# Patient Record
Sex: Male | Born: 1975 | Race: White | Hispanic: No | Marital: Single | Smoking: Never smoker
Health system: Southern US, Community
[De-identification: ages and names within clinical notes are randomized; demographics above are authoritative.]

## PROBLEM LIST (undated history)

## (undated) HISTORY — PX: APPENDECTOMY: SHX54

## (undated) HISTORY — PX: FRACTURE SURGERY: SHX138

---

## 2017-11-06 ENCOUNTER — Encounter (HOSPITAL_COMMUNITY): Payer: Self-pay | Admitting: Emergency Medicine

## 2017-11-06 ENCOUNTER — Emergency Department (HOSPITAL_COMMUNITY): Payer: Self-pay

## 2017-11-06 ENCOUNTER — Emergency Department (HOSPITAL_COMMUNITY)
Admission: EM | Admit: 2017-11-06 | Discharge: 2017-11-06 | Disposition: A | Discharge status: Home / Self Care | Payer: Self-pay | Attending: Emergency Medicine | Admitting: Emergency Medicine

## 2017-11-06 DIAGNOSIS — H5712 Ocular pain, left eye: Secondary | ICD-10-CM | POA: Insufficient documentation

## 2017-11-06 DIAGNOSIS — W208XXA Other cause of strike by thrown, projected or falling object, initial encounter: Secondary | ICD-10-CM | POA: Insufficient documentation

## 2017-11-06 DIAGNOSIS — H538 Other visual disturbances: Secondary | ICD-10-CM | POA: Insufficient documentation

## 2017-11-06 DIAGNOSIS — H53149 Visual discomfort, unspecified: Secondary | ICD-10-CM | POA: Insufficient documentation

## 2017-11-06 MED ORDER — PREDNISOLONE ACETATE 1 % OP SUSP
1.0000 [drp] | Freq: Once | OPHTHALMIC | Status: AC
  Administered 2017-11-06: 1 [drp] via OPHTHALMIC
  Filled 2017-11-06: qty 5

## 2017-11-06 MED ORDER — FLUORESCEIN SODIUM 1 MG OP STRP
1.0000 | ORAL_STRIP | Freq: Once | OPHTHALMIC | Status: AC
  Administered 2017-11-06: 1 via OPHTHALMIC
  Filled 2017-11-06: qty 1

## 2017-11-06 MED ORDER — PROPARACAINE HCL 0.5 % OP SOLN
1.0000 [drp] | Freq: Once | OPHTHALMIC | Status: AC
  Administered 2017-11-06: 1 [drp] via OPHTHALMIC
  Filled 2017-11-06: qty 15

## 2017-11-06 NOTE — Discharge Instructions (Addendum)
As discussed, it is important that you use the provided twice daily until you see your optometrist in 48 hours. Return here for concerning changes in your condition.

## 2017-11-06 NOTE — ED Notes (Signed)
Patient transported to CT 
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
## 2017-11-06 NOTE — ED Notes (Signed)
Visual acuity: with corrective lenses L eye: 20/20 R eye: 20/70 Both eyes: 20/20
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
## 2019-06-20 IMAGING — CT CT HEAD W/O CM
3 series · 15 of 47 positions shown, 18 images · non-contrast
Comparison: None.

CLINICAL DATA: Facial and forehead blunt trauma. Blurred vision on
the left.

EXAM:
CT HEAD WITHOUT CONTRAST
TECHNIQUE: Contiguous axial images were obtained from the base of the skull
through the vertex without intravenous contrast.

[Series 2: head wo · axial · 0.47mm/px · z∈[-88,+37]mm · 9 of 30 slices shown, 12 images]
[im 3/30  brain]
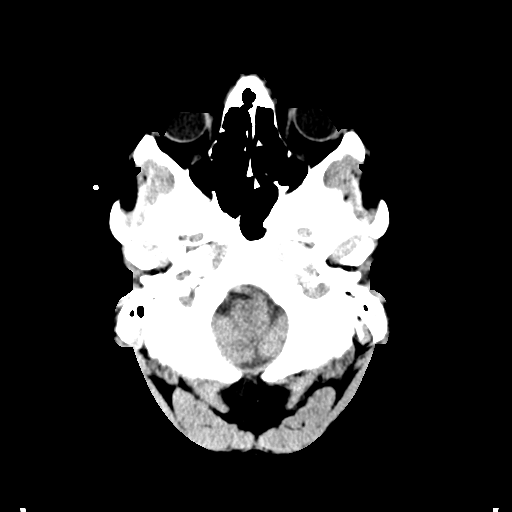
[im 3/30  bone]
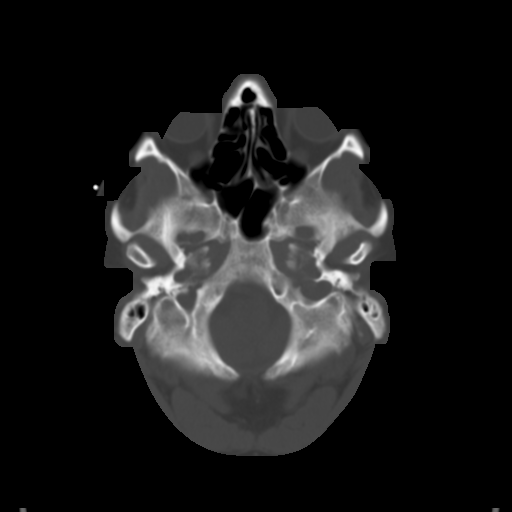
[im 6/30  brain]
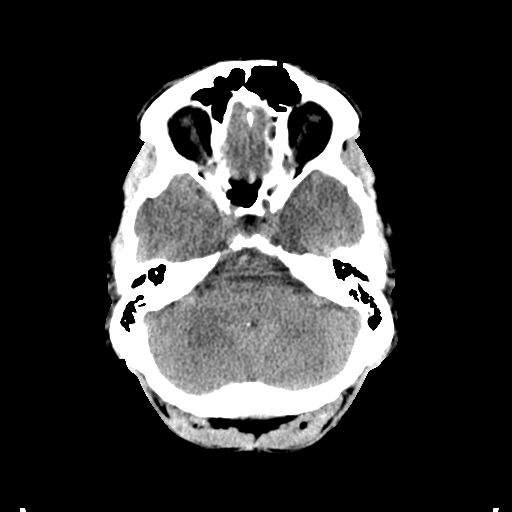
[im 9/30  brain]
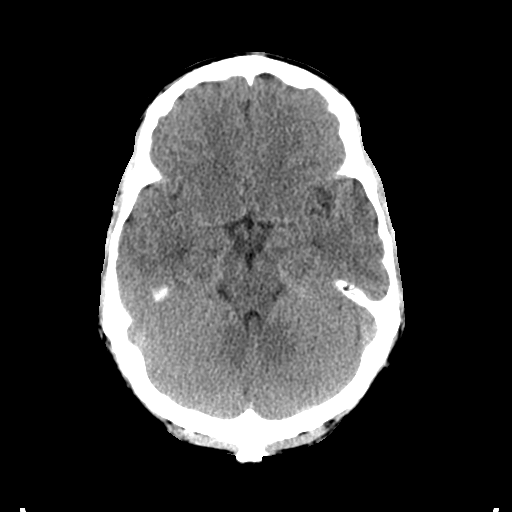
[im 12/30  brain]
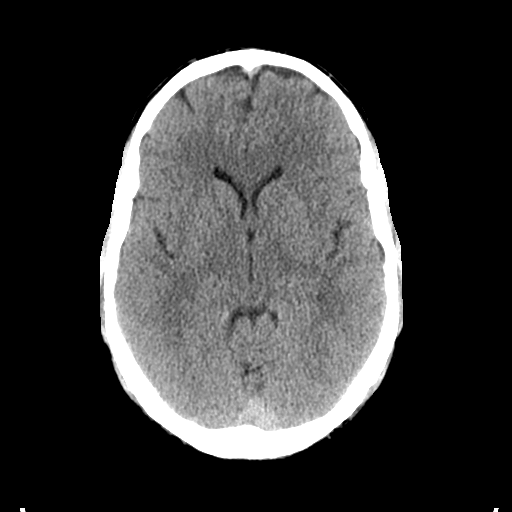
[im 16/30  brain]
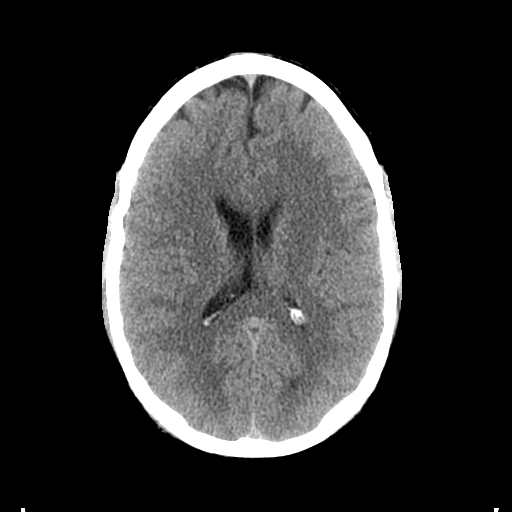
[im 16/30  bone]
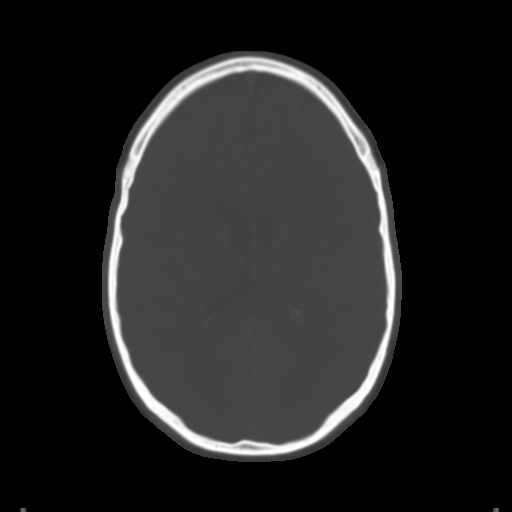
[im 19/30  brain]
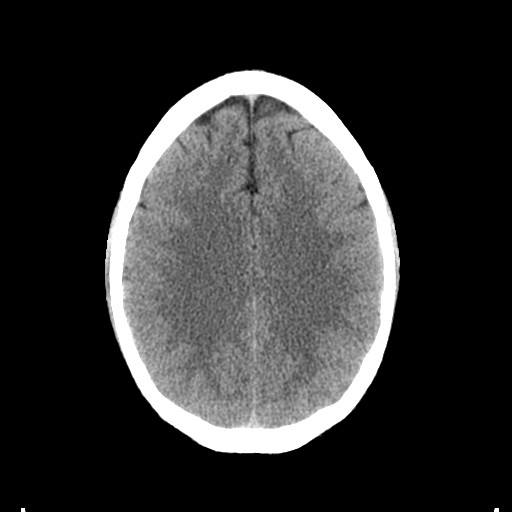
[im 22/30  brain]
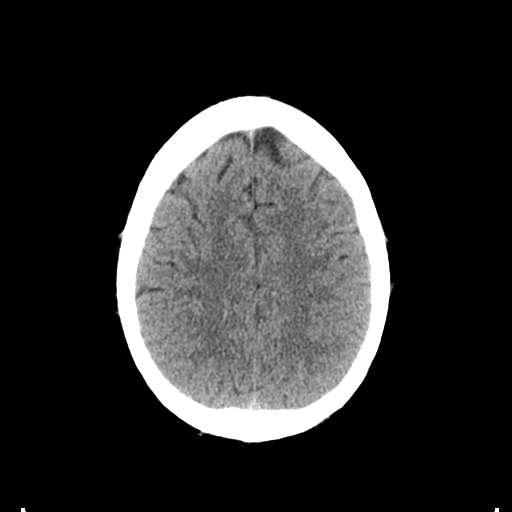
[im 25/30  brain]
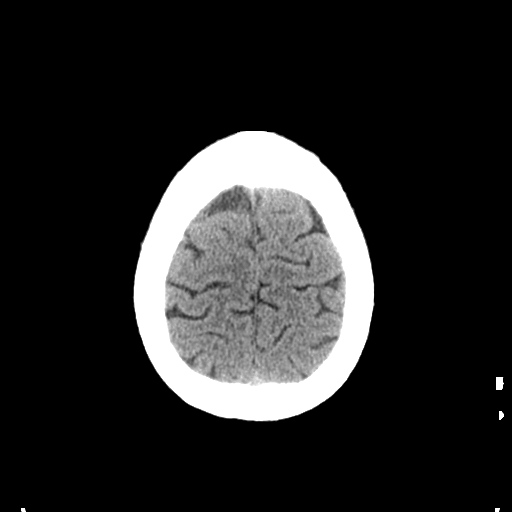
[im 28/30  brain]
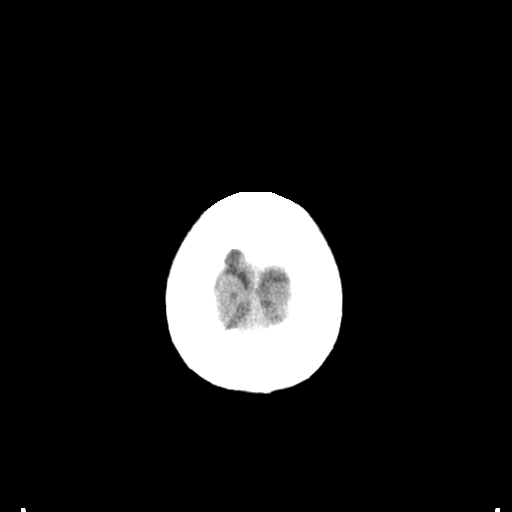
[im 28/30  bone]
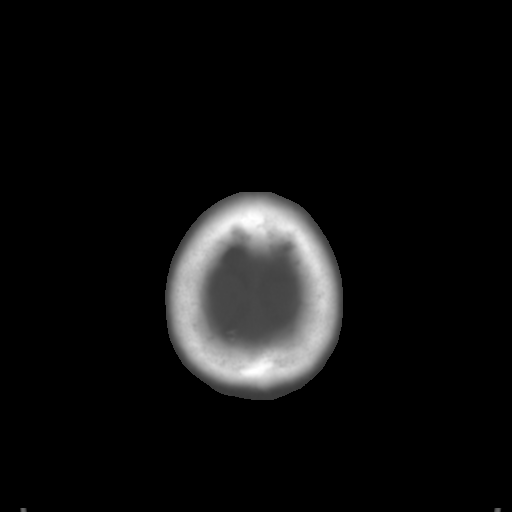

[Series 4: coronal soft tissue · coronal · 0.39mm/px · 3 of 66 slices shown]
[im 22/66  brain]
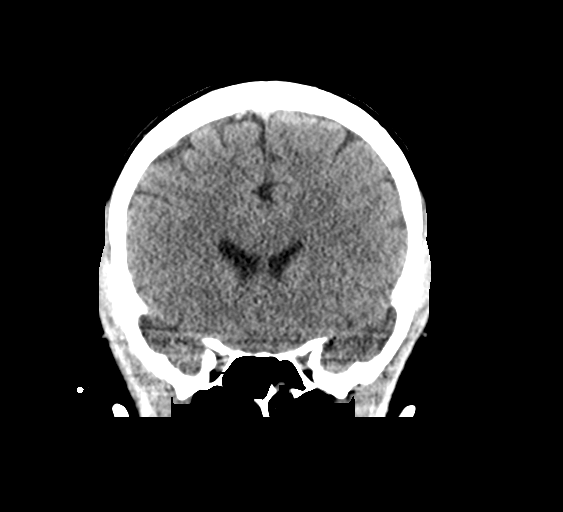
[im 29/66  brain]
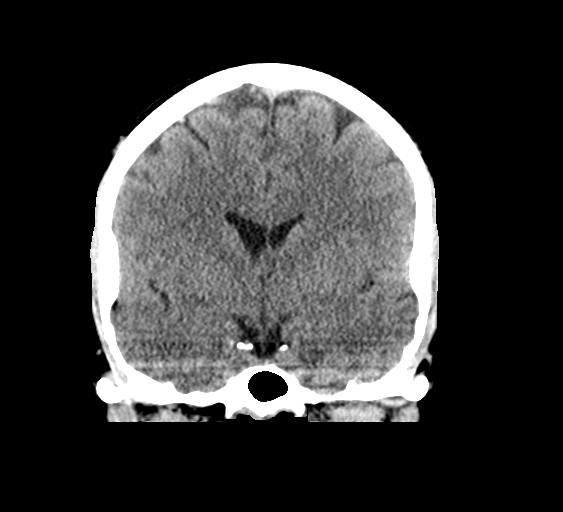
[im 37/66  brain]
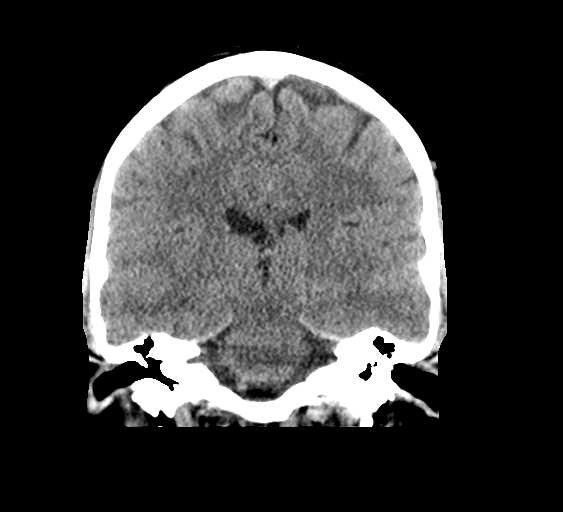

[Series 5: sagittal soft tissue · sagittal · 0.38mm/px · 3 of 51 slices shown]
[im 17/51  brain]
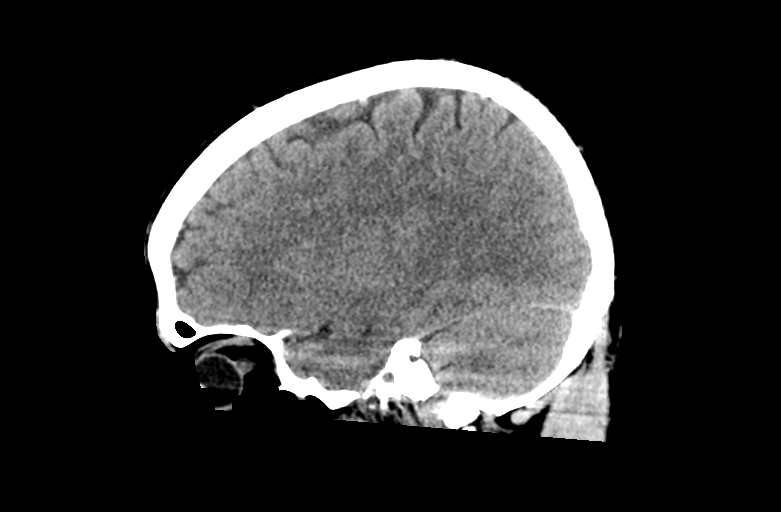
[im 26/51  brain]
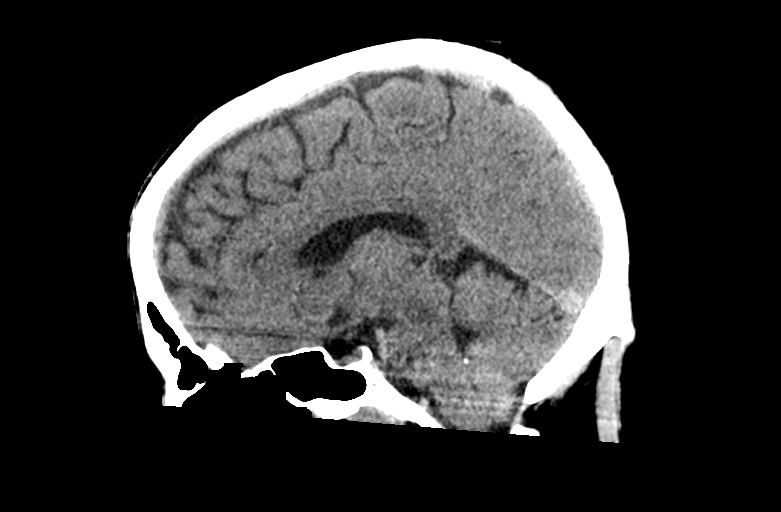
[im 34/51  brain]
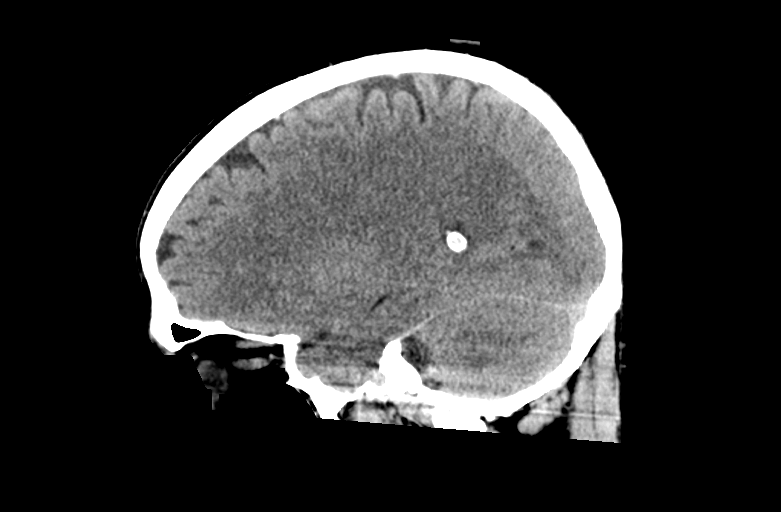

[15 of 47 positions shown; findings below may reference images not displayed]

FINDINGS: Brain: No acute intracranial abnormality. Specifically, no
hemorrhage, hydrocephalus, mass lesion, acute infarction, or
significant intracranial injury.

Vascular: No hyperdense vessel or unexpected calcification.

Skull: No acute calvarial abnormality.

Sinuses/Orbits: Visualized paranasal sinuses and mastoids clear.
Orbital soft tissues unremarkable.

Other: None
IMPRESSION: Normal study.

## 2020-02-17 ENCOUNTER — Emergency Department (HOSPITAL_COMMUNITY): Payer: Self-pay

## 2020-02-17 ENCOUNTER — Other Ambulatory Visit: Payer: Self-pay

## 2020-02-17 ENCOUNTER — Encounter (HOSPITAL_COMMUNITY): Payer: Self-pay | Admitting: *Deleted

## 2020-02-17 ENCOUNTER — Emergency Department (HOSPITAL_COMMUNITY)
Admission: EM | Admit: 2020-02-17 | Discharge: 2020-02-17 | Disposition: A | Discharge status: Home / Self Care | Payer: Self-pay | Attending: Emergency Medicine | Admitting: Emergency Medicine

## 2020-02-17 DIAGNOSIS — W500XXA Accidental hit or strike by another person, initial encounter: Secondary | ICD-10-CM | POA: Insufficient documentation

## 2020-02-17 DIAGNOSIS — S4991XA Unspecified injury of right shoulder and upper arm, initial encounter: Secondary | ICD-10-CM | POA: Insufficient documentation

## 2020-02-17 MED ORDER — NAPROXEN 500 MG PO TABS: 500.0000 mg | ORAL_TABLET | Freq: Two times a day (BID) | ORAL | 0 refills | Status: AC | PRN

## 2020-02-17 MED ORDER — METHOCARBAMOL 500 MG PO TABS: 500.0000 mg | ORAL_TABLET | Freq: Three times a day (TID) | ORAL | 0 refills | Status: AC | PRN

## 2020-02-17 MED ORDER — NAPROXEN 500 MG PO TABS
500.0000 mg | ORAL_TABLET | Freq: Once | ORAL | Status: AC
  Administered 2020-02-17: 500 mg via ORAL
  Filled 2020-02-17: qty 1
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
## 2020-02-17 NOTE — ED Triage Notes (Signed)
Pt injured RUE two to three days ago and thinks he has fractured it. Noted swelling to the right thumb and pt states he can't move his hand normally.

## 2020-02-17 NOTE — ED Notes (Signed)
Patient transported to X-ray 

## 2020-02-17 NOTE — ED Notes (Signed)
Patient ambulated to the room with c/o right thumb, wrist, and upper arm pain that started from an injury at work last Thursday.

## 2020-02-17 NOTE — Discharge Instructions (Signed)
Please read and follow all provided instructions.  You have been seen today for an injury to your right upper extremity.  Tests performed today include: An x-ray of the affected areas - does NOT show any broken bones or dislocations.  Vital signs. See below for your results today.   As we discussed we are concerned that you have injured a tendon, we have placed you into a brace to wear, please give this on at all times until you have followed up with hand surgery.  Please try to rest.  Medications:  - Naproxen is a nonsteroidal anti-inflammatory medication that will help with pain and swelling. Be sure to take this medication as prescribed with food, 1 pill every 12 hours,  It should be taken with food, as it can cause stomach upset, and more seriously, stomach bleeding. Do not take other nonsteroidal anti-inflammatory medications with this such as Advil, Motrin, Aleve, Mobic, Goodie Powder, or Motrin.    - Robaxin is the muscle relaxer I have prescribed, this is meant to help with muscle tightness. Be aware that this medication may make you drowsy therefore the first time you take this it should be at a time you are in an environment where you can rest. Do not drive or operate heavy machinery when taking this medication. Do not drink alcohol or take other sedating medications with this medicine such as narcotics or benzodiazepines.   You make take Tylenol per over the counter dosing with these medications.   We have prescribed you new medication(s) today. Discuss the medications prescribed today with your pharmacist as they can have adverse effects and interactions with your other medicines including over the counter and prescribed medications. Seek medical evaluation if you start to experience new or abnormal symptoms after taking one of these medicines, seek care immediately if you start to experience difficulty breathing, feeling of your throat closing, facial swelling, or rash as these could be  indications of a more serious allergic reaction   Follow-up instructions: Please follow-up with Dr. Orlan Leavens, call tomorrow for soonest possible appointment.  Return instructions:  Please return if your digits or extremity are numb or tingling, appear gray or blue, or you have severe pain (also elevate the extremity and loosen splint or wrap if you were given one) Please return if you have redness or fevers.  Please return to the Emergency Department if you experience worsening symptoms.  Please return if you have any other emergent concerns. Additional Information:  Your vital signs today were: BP 118/78   Pulse 68   Temp 98.1 F (36.7 C) (Oral)   Resp 17   Ht 5\' 11"  (1.803 m)   Wt 80.7 kg   SpO2 99%   BMI 24.83 kg/m  If your blood pressure (BP) was elevated above 135/85 this visit, please have this repeated by your doctor within one month. ---------------

## 2021-09-30 IMAGING — CR DG HAND COMPLETE 3+V*R*
3 series · 3 of 3 positions shown · non-contrast
Comparison: None.

CLINICAL DATA: Pain. Patient reports a log fell on him 1 week ago
injuring his arm, progressive pain. Painful range of motion. Thumb
very painful.

EXAM:
RIGHT HAND - COMPLETE 3+ VIEW

[x hand pa right]
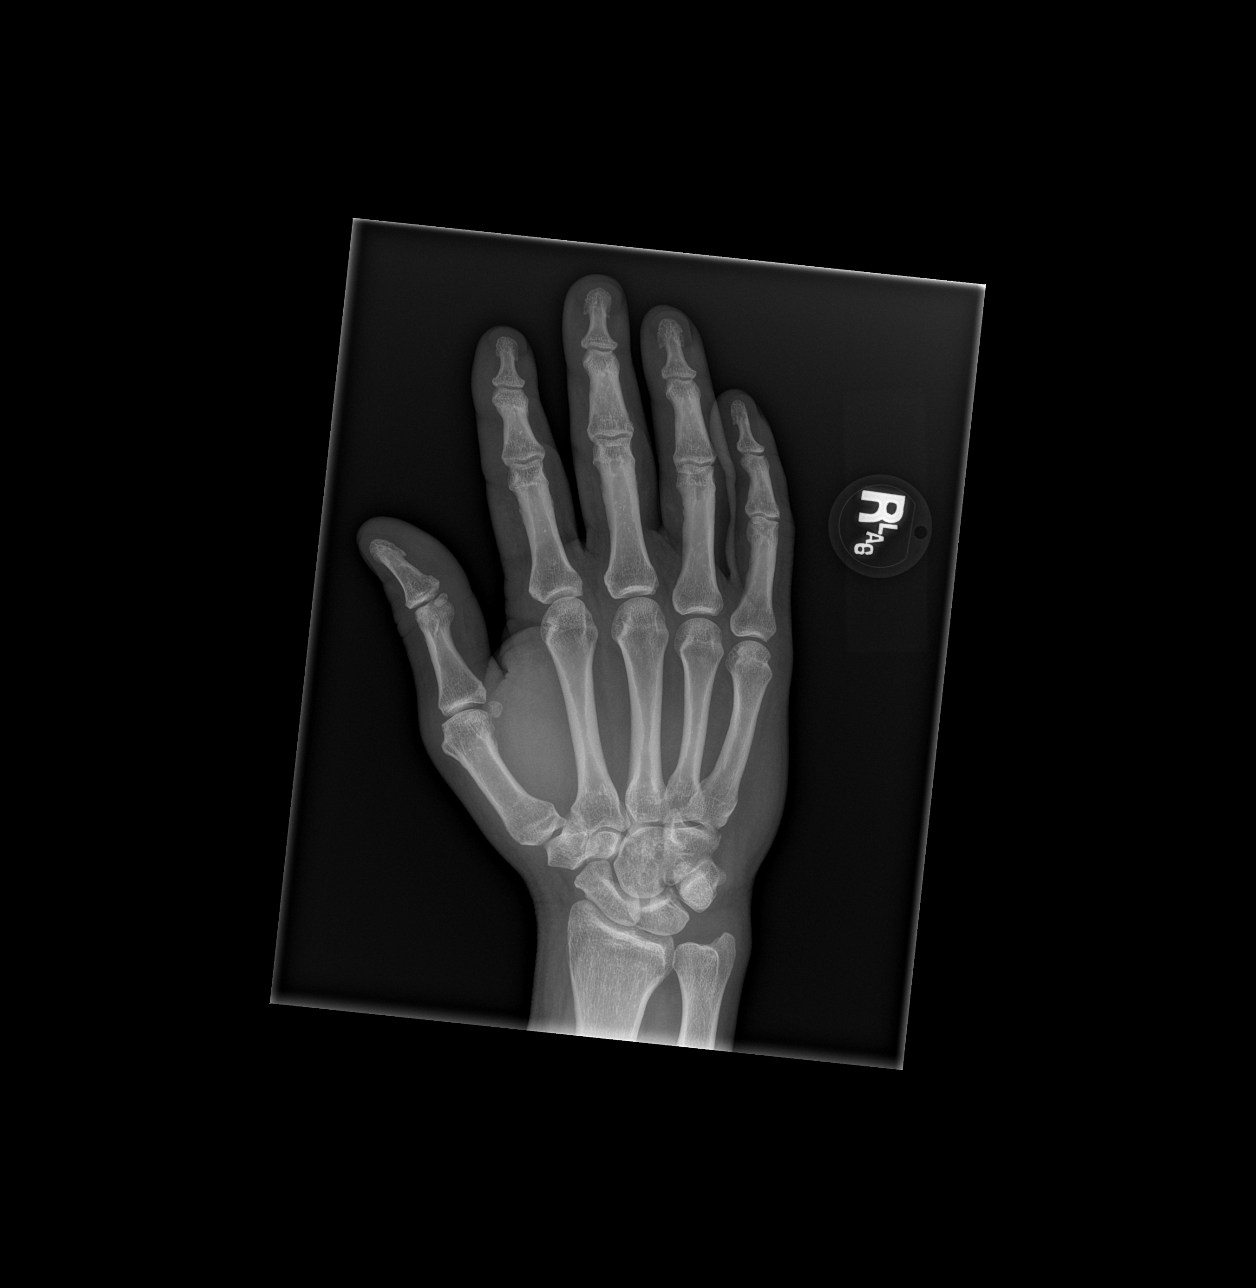

[x hand obl right]
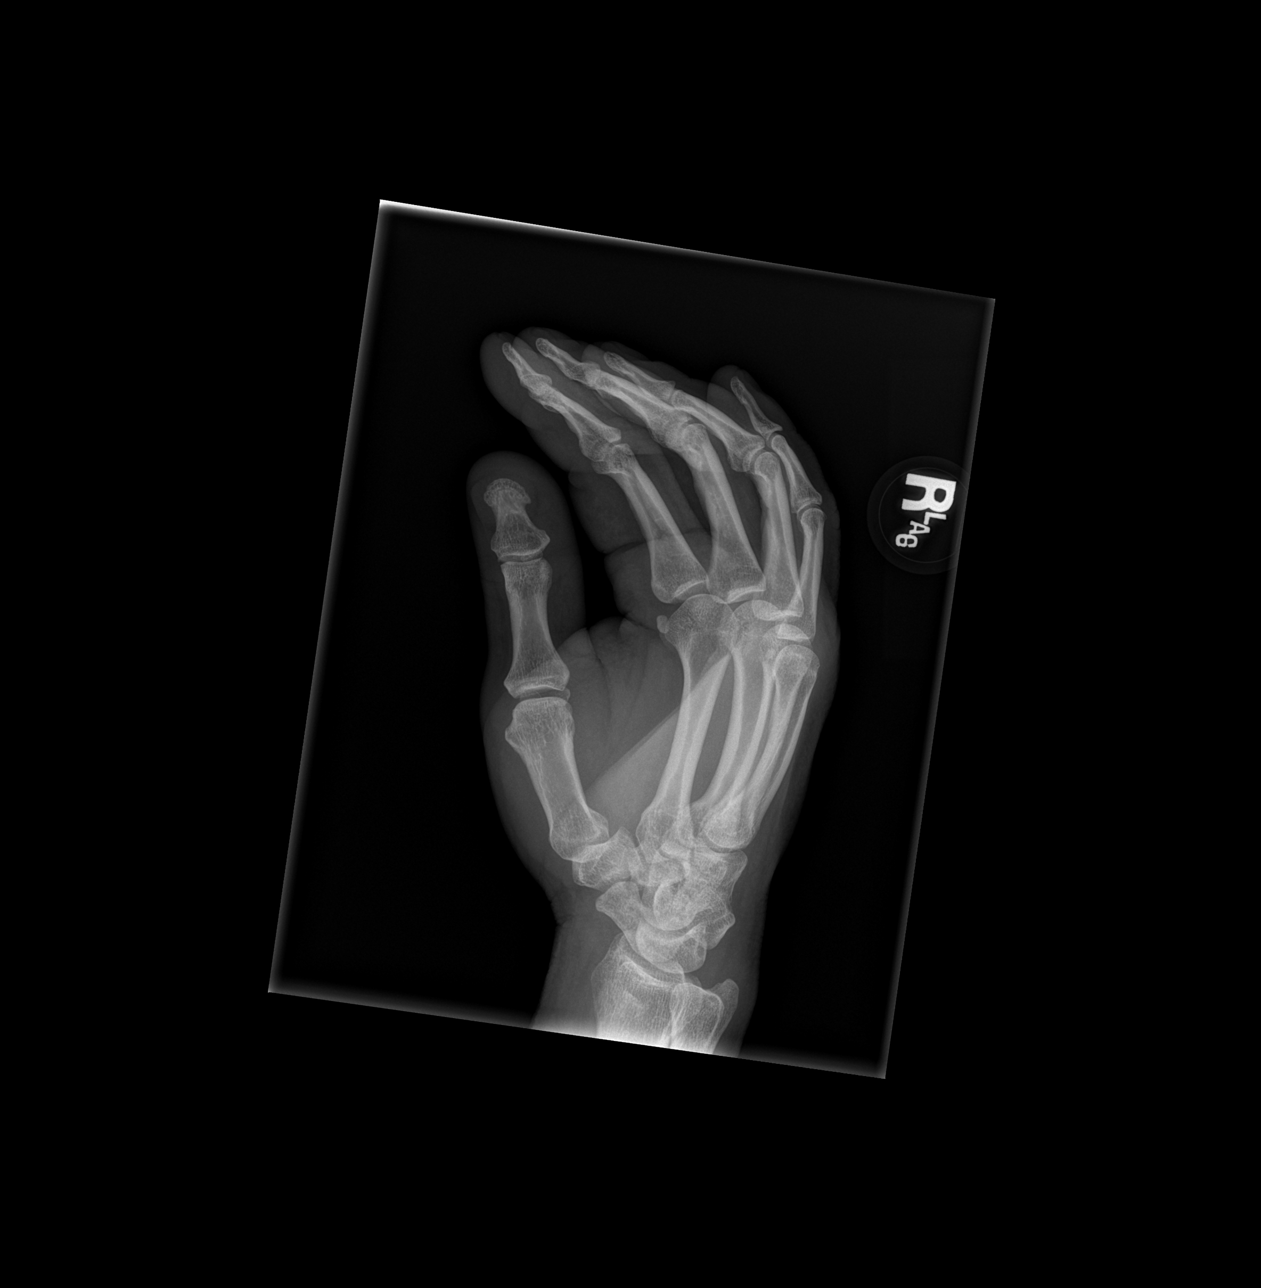

[x hand lat right]
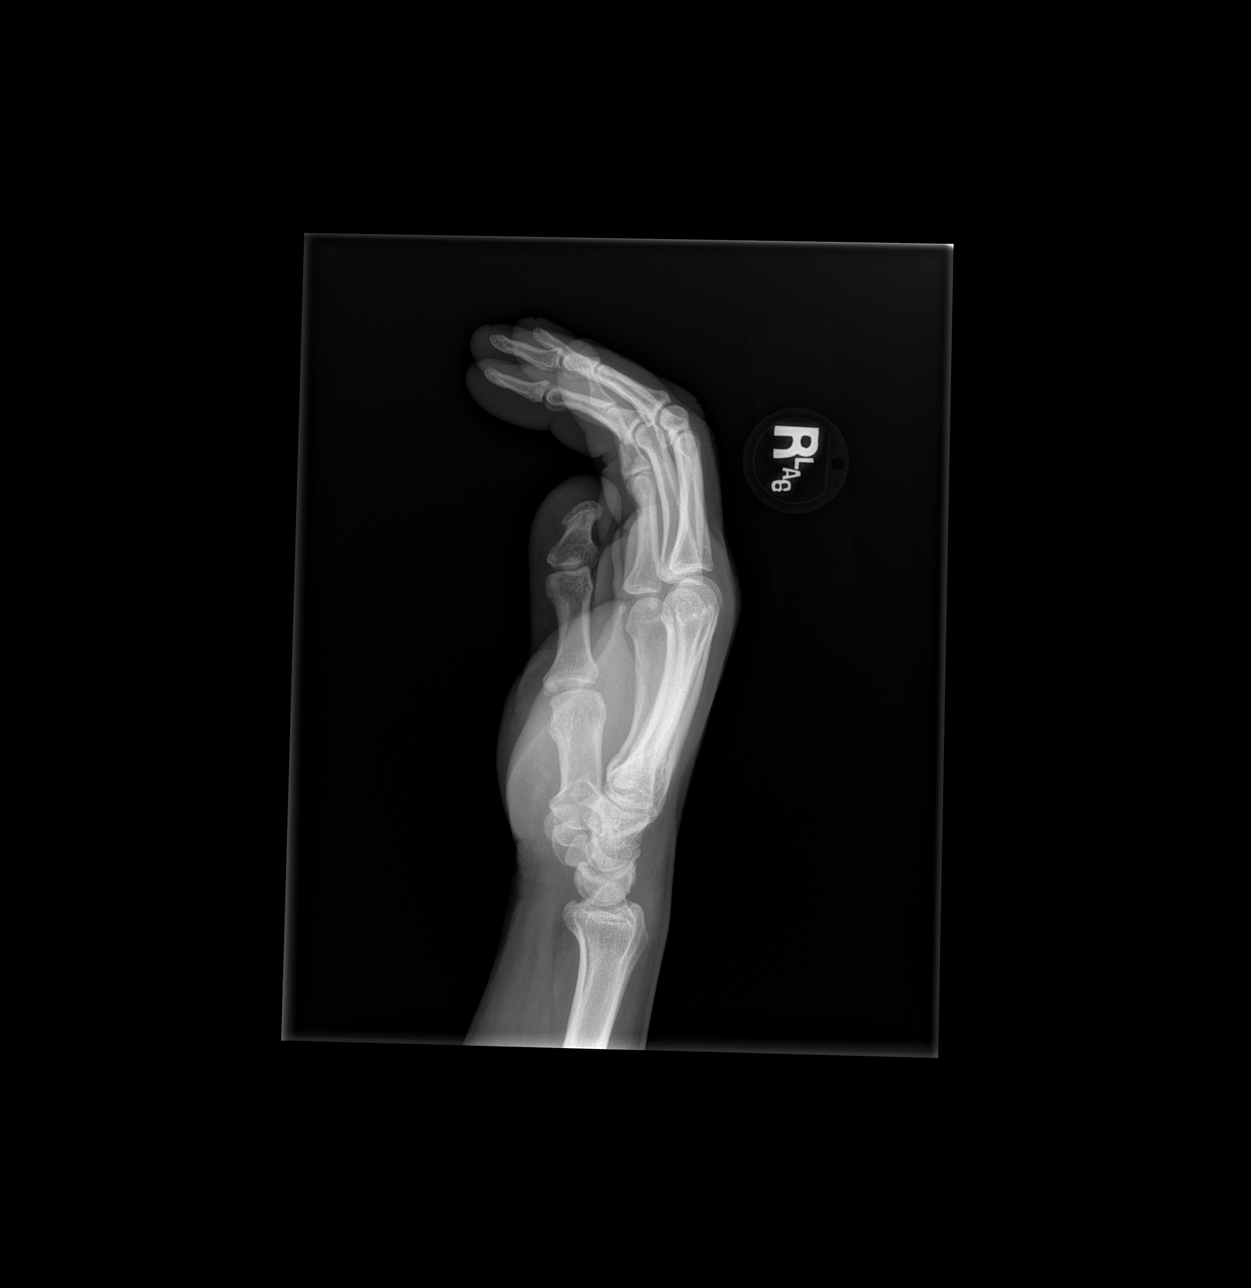

[3 of 3 positions shown; findings below may reference images not displayed]

FINDINGS: There is no evidence of fracture or dislocation. There is no
evidence of arthropathy or other focal bone abnormality. Soft
tissues are unremarkable.
IMPRESSION: Negative radiographs of the right hand.

## 2022-05-10 ENCOUNTER — Encounter (HOSPITAL_COMMUNITY): Payer: Self-pay

## 2022-05-10 ENCOUNTER — Emergency Department (HOSPITAL_COMMUNITY)
Admission: EM | Admit: 2022-05-10 | Discharge: 2022-05-10 | Disposition: A | Discharge status: Home / Self Care | Payer: Self-pay | Attending: Emergency Medicine | Admitting: Emergency Medicine

## 2022-05-10 ENCOUNTER — Other Ambulatory Visit: Payer: Self-pay

## 2022-05-10 DIAGNOSIS — H16002 Unspecified corneal ulcer, left eye: Secondary | ICD-10-CM | POA: Insufficient documentation

## 2022-05-10 MED ORDER — GATIFLOXACIN 0.5 % OP SOLN
1.0000 [drp] | Freq: Four times a day (QID) | OPHTHALMIC | Status: DC
  Filled 2022-05-10: qty 2.5

## 2022-05-10 MED ORDER — FLUORESCEIN SODIUM 1 MG OP STRP
1.0000 | ORAL_STRIP | Freq: Once | OPHTHALMIC | Status: DC
  Filled 2022-05-10: qty 1

## 2022-05-10 MED ORDER — TETRACAINE HCL 0.5 % OP SOLN
2.0000 [drp] | Freq: Once | OPHTHALMIC | Status: DC
  Filled 2022-05-10: qty 4

## 2022-05-10 NOTE — Discharge Instructions (Signed)
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  You are being started on antibiotic drops.  I discussed this with the eye doctor on-call.  Use the drops tonight 4-6 times, go to their office in the morning.  After today he can start using the drops 4-6 times a day.  Do not put your contacts in.
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
## 2023-08-15 ENCOUNTER — Emergency Department (HOSPITAL_COMMUNITY)
Admission: EM | Admit: 2023-08-15 | Discharge: 2023-08-15 | Disposition: A | Discharge status: Home / Self Care | Payer: Self-pay | Attending: Emergency Medicine | Admitting: Emergency Medicine

## 2023-08-15 ENCOUNTER — Emergency Department (HOSPITAL_COMMUNITY): Payer: Self-pay

## 2023-08-15 ENCOUNTER — Other Ambulatory Visit: Payer: Self-pay

## 2023-08-15 ENCOUNTER — Encounter (HOSPITAL_COMMUNITY): Payer: Self-pay | Admitting: Emergency Medicine

## 2023-08-15 DIAGNOSIS — R112 Nausea with vomiting, unspecified: Secondary | ICD-10-CM | POA: Insufficient documentation

## 2023-08-15 DIAGNOSIS — R109 Unspecified abdominal pain: Secondary | ICD-10-CM

## 2023-08-15 DIAGNOSIS — R55 Syncope and collapse: Secondary | ICD-10-CM

## 2023-08-15 DIAGNOSIS — E860 Dehydration: Secondary | ICD-10-CM

## 2023-08-15 DIAGNOSIS — R197 Diarrhea, unspecified: Secondary | ICD-10-CM | POA: Insufficient documentation

## 2023-08-15 LAB — URINALYSIS, ROUTINE W REFLEX MICROSCOPIC
Glucose, UA: NEGATIVE mg/dL
Hgb urine dipstick: NEGATIVE
Ketones, ur: NEGATIVE mg/dL
Leukocytes,Ua: NEGATIVE
Nitrite: NEGATIVE
Protein, ur: 100 mg/dL — AB
Specific Gravity, Urine: 1.038 — ABNORMAL HIGH (ref 1.005–1.030)
pH: 5 (ref 5.0–8.0)

## 2023-08-15 LAB — CBC WITH DIFFERENTIAL/PLATELET
Abs Immature Granulocytes: 0.04 10*3/uL (ref 0.00–0.07)
Basophils Absolute: 0 10*3/uL (ref 0.0–0.1)
Basophils Relative: 0 %
Eosinophils Absolute: 0.1 10*3/uL (ref 0.0–0.5)
Eosinophils Relative: 1 %
HCT: 45.1 % (ref 39.0–52.0)
Hemoglobin: 15.2 g/dL (ref 13.0–17.0)
Immature Granulocytes: 0 %
Lymphocytes Relative: 6 %
Lymphs Abs: 0.7 10*3/uL (ref 0.7–4.0)
MCH: 29.9 pg (ref 26.0–34.0)
MCHC: 33.7 g/dL (ref 30.0–36.0)
MCV: 88.6 fL (ref 80.0–100.0)
Monocytes Absolute: 0.5 10*3/uL (ref 0.1–1.0)
Monocytes Relative: 5 %
Neutro Abs: 9.7 10*3/uL — ABNORMAL HIGH (ref 1.7–7.7)
Neutrophils Relative %: 88 %
Platelets: 201 10*3/uL (ref 150–400)
RBC: 5.09 MIL/uL (ref 4.22–5.81)
RDW: 12.3 % (ref 11.5–15.5)
WBC: 11.1 10*3/uL — ABNORMAL HIGH (ref 4.0–10.5)
nRBC: 0 % (ref 0.0–0.2)

## 2023-08-15 LAB — RAPID URINE DRUG SCREEN, HOSP PERFORMED
Amphetamines: NOT DETECTED
Barbiturates: NOT DETECTED
Benzodiazepines: NOT DETECTED
Cocaine: NOT DETECTED
Opiates: NOT DETECTED
Tetrahydrocannabinol: NOT DETECTED

## 2023-08-15 LAB — TROPONIN I (HIGH SENSITIVITY): Troponin I (High Sensitivity): 5 ng/L (ref <18)

## 2023-08-15 LAB — COMPREHENSIVE METABOLIC PANEL WITH GFR
ALT: 27 U/L (ref 0–44)
AST: 29 U/L (ref 15–41)
Albumin: 4.4 g/dL (ref 3.5–5.0)
Alkaline Phosphatase: 64 U/L (ref 38–126)
Anion gap: 12 (ref 5–15)
BUN: 19 mg/dL (ref 6–20)
CO2: 22 mmol/L (ref 22–32)
Calcium: 9.5 mg/dL (ref 8.9–10.3)
Chloride: 101 mmol/L (ref 98–111)
Creatinine, Ser: 1.21 mg/dL (ref 0.61–1.24)
GFR, Estimated: >60 mL/min (ref >60)
Glucose, Bld: 104 mg/dL — ABNORMAL HIGH (ref 70–99)
Potassium: 3.9 mmol/L (ref 3.5–5.1)
Sodium: 135 mmol/L (ref 135–145)
Total Bilirubin: 1.2 mg/dL (ref 0.0–1.2)
Total Protein: 8.2 g/dL — ABNORMAL HIGH (ref 6.5–8.1)

## 2023-08-15 LAB — LIPASE, BLOOD: Lipase: 35 U/L (ref 11–51)

## 2023-08-15 MED ORDER — IOHEXOL 300 MG/ML  SOLN
100.0000 mL | Freq: Once | INTRAMUSCULAR | Status: AC | PRN
  Administered 2023-08-15: 100 mL via INTRAVENOUS

## 2023-08-15 MED ORDER — ONDANSETRON 4 MG PO TBDP: ORAL_TABLET | ORAL | 0 refills | Status: AC

## 2023-08-15 MED ORDER — ONDANSETRON 4 MG PO TBDP
4.0000 mg | ORAL_TABLET | Freq: Once | ORAL | Status: AC
  Administered 2023-08-15: 4 mg via ORAL
  Filled 2023-08-15: qty 1

## 2023-08-15 MED ORDER — LOPERAMIDE HCL 2 MG PO CAPS
4.0000 mg | ORAL_CAPSULE | Freq: Once | ORAL | Status: AC
  Administered 2023-08-15: 4 mg via ORAL
  Filled 2023-08-15: qty 2

## 2023-08-15 MED ORDER — SODIUM CHLORIDE 0.9 % IV BOLUS
1000.0000 mL | Freq: Once | INTRAVENOUS | Status: AC
  Administered 2023-08-15: 1000 mL via INTRAVENOUS

## 2023-08-15 NOTE — ED Provider Triage Note (Signed)
 Emergency Medicine Provider Triage Evaluation Note  AMERY MINASYAN , a 48 y.o. male  was evaluated in triage.  Pt complains of nausea, vomiting, diarrhea.  Patient reports that he began to experience symptoms of gastrointestinal distress starting around midnight last night.  Has been unable to tolerate oral intake.  Multiple episodes of vomiting and diarrhea.  Diffuse abdominal tenderness.  Denies any recent fever, chills or bodyaches.  No sick contacts as far as patient is aware. Denies obvious hematemesis, hematochezia, or melanotic stools. Does endorse occasionally having some bright red blood with mildly painful bowel movements.  Review of Systems  Positive: As above Negative: As above  Physical Exam  BP 110/63 (BP Location: Left Arm)   Pulse 73   Temp 99.4 F (37.4 C) (Oral)   Resp 17   Ht 5\' 10"  (1.778 m)   Wt 84.8 kg   SpO2 100%   BMI 26.83 kg/m  Gen:   Awake, no distress   Resp:  Normal effort  MSK:   Moves extremities without difficulty  Other:    Medical Decision Making  Medically screening exam initiated at 3:30 PM.  Appropriate orders placed.  GUERRY COVINGTON was informed that the remainder of the evaluation will be completed by another provider, this initial triage assessment does not replace that evaluation, and the importance of remaining in the ED until their evaluation is complete.     Keneshia Tena A, PA-C 08/15/23 1533

## 2023-08-15 NOTE — Discharge Instructions (Signed)
 Take Zofran  as needed for nausea.  As we discussed your CT scan showed that you have a stomach virus  You can take Imodium  2 tablets every time you have diarrhea up to 10 a day  See your doctor for follow-up  Return to ER if you have another episode of passing out, severe abdominal pain or vomiting

## 2023-08-15 NOTE — ED Triage Notes (Signed)
 Since 0200 this AM, patient had experienced vomiting and diarrhea. He also noticed diaphoresis and dizziness why pumping gas today to the point he had to sit down to avoid syncope. He is unsure of what may be the cause.

## 2023-08-15 NOTE — ED Provider Notes (Signed)
 [facility] AT [facility] Provider Note   CSN: [id number] Arrival date & time: 08/15/23  1505     History  Chief Complaint  Patient presents with   Emesis   Dizziness   Diarrhea    Kirk Jenkins is a 48 y.o. male presenting with vomiting and diarrhea and near syncope.  Patient states that for several days, he has been not feeling well.  He has been having diarrhea and vomiting since this morning.  He states that he had some blood-tinged stool as well.  Patient states that he was feeling gas at the gas station before he goes to work and felt very lightheaded and dizzy and almost passed out.  Denies any cardiac problems.  Patient also has complaints of right flank pain.  Denies any urinary symptoms or fever.  The history is provided by the patient.       Home Medications Prior to Admission medications   Medication Sig Start Date End Date Taking? Authorizing Provider  methocarbamol  (ROBAXIN ) 500 MG tablet Take 1 tablet (500 mg total) by mouth every 8 (eight) hours as needed for muscle spasms. 02/17/20   Petrucelli, Samantha R, PA-C  naproxen  (NAPROSYN ) 500 MG tablet Take 1 tablet (500 mg total) by mouth 2 (two) times daily as needed for moderate pain. 02/17/20   Petrucelli, Samantha R, PA-C      Allergies    Codeine and Hydrocodone-acetaminophen    Review of Systems   Review of Systems  Gastrointestinal:  Positive for diarrhea and vomiting.  Neurological:  Positive for dizziness.  All other systems reviewed and are negative.   Physical Exam Updated Vital Signs BP 129/62   Pulse 68   Temp 98.1 F (36.7 C) (Oral)   Resp 14   Ht 5\' 10"  (1.778 m)   Wt 84.8 kg   SpO2 99%   BMI 26.83 kg/m  Physical Exam Vitals and nursing note reviewed.  HENT:     Head: Normocephalic.     Nose: Nose normal.     Mouth/Throat:     Mouth: Mucous membranes are dry.  Eyes:     Extraocular Movements: Extraocular movements intact.     Pupils: Pupils  are equal, round, and reactive to light.  Cardiovascular:     Rate and Rhythm: Normal rate and regular rhythm.     Pulses: Normal pulses.  Pulmonary:     Effort: Pulmonary effort is normal.     Breath sounds: Normal breath sounds.  Abdominal:     Comments: Mild diffuse tenderness and distention.  No rebound.  Mild right CVA tenderness  Musculoskeletal:        General: Normal range of motion.     Cervical back: Normal range of motion and neck supple.  Skin:    General: Skin is warm.     Capillary Refill: Capillary refill takes less than 2 seconds.  Neurological:     General: No focal deficit present.     Mental Status: He is alert and oriented to person, place, and time.  Psychiatric:        Mood and Affect: Mood normal.        Behavior: Behavior normal.     ED Results / Procedures / Treatments   Labs (all labs ordered are listed, but only abnormal results are displayed) Labs Reviewed  CBC WITH DIFFERENTIAL/PLATELET - Abnormal; Notable for the following components:      Result Value   WBC 11.1 (*)  Neutro Abs 9.7 (*)    All other components within normal limits  COMPREHENSIVE METABOLIC PANEL WITH GFR - Abnormal; Notable for the following components:   Glucose, Bld 104 (*)    Total Protein 8.2 (*)    All other components within normal limits  URINALYSIS, ROUTINE W REFLEX MICROSCOPIC - Abnormal; Notable for the following components:   Color, Urine AMBER (*)    APPearance HAZY (*)    Specific Gravity, Urine 1.038 (*)    Bilirubin Urine SMALL (*)    Protein, ur 100 (*)    Bacteria, UA FEW (*)    All other components within normal limits  LIPASE, BLOOD  RAPID URINE DRUG SCREEN, HOSP PERFORMED  TROPONIN I (HIGH SENSITIVITY)  TROPONIN I (HIGH SENSITIVITY)    EKG EKG Interpretation Date/Time:  Thursday August 15 2023 15:36:41 EDT Ventricular Rate:  70 PR Interval:  133 QRS Duration:  85 QT Interval:  332 QTC Calculation: 359 R Axis:   75  Text  Interpretation: Sinus rhythm No significant change since last tracing Confirmed by Florette Hurry [phone]) on 08/15/2023 4:45:07 PM  Radiology CT ABDOMEN PELVIS W CONTRAST Result Date: 08/15/2023 CLINICAL DATA:  Abdominal pain, vomiting and diarrhea. Diaphoresis and dizziness. EXAM: CT ABDOMEN AND PELVIS WITH CONTRAST TECHNIQUE: Multidetector CT imaging of the abdomen and pelvis was performed using the standard protocol following bolus administration of intravenous contrast. RADIATION DOSE REDUCTION: This exam was performed according to the departmental dose-optimization program which includes automated exposure control, adjustment of the mA and/or kV according to patient size and/or use of iterative reconstruction technique. CONTRAST:  OMNIPAQUE  IOHEXOL  300 MG/ML  SOLN COMPARISON:  None Available. FINDINGS: Lower chest: Dependent subsegmental atelectasis in both lower lobes. Linear subsegmental atelectasis or scarring medially in the right middle lobe. Old granulomatous disease noted. 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 Both adrenal glands appear normal. Stomach/Bowel: Mild descending colon diverticulosis. Fluid in the distal colon compatible with diarrheal process. Otherwise did distal colon is empty. Fluid-filled loops of nondilated small bowel noted. Normal appendix. Vascular/Lymphatic: Unremarkable. No significant atheromatous plaque noted. Reproductive: Unremarkable Other: No supplemental non-categorized findings. Musculoskeletal: Hemangioma in the L4 vertebral body. IMPRESSION: 1. Fluid in the distal colon compatible with diarrheal process. Fluid-filled loops of nondilated small bowel noted. 2. 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 3. Mild descending colon  diverticulosis. 4. Dependent subsegmental atelectasis in both lower lobes. Linear subsegmental atelectasis or scarring medially in the right middle lobe. Electronically Signed   By: Freida Jes M.D.   On: 08/15/2023 19:13    Procedures Procedures    Medications Ordered in ED Medications  loperamide  (IMODIUM ) capsule 4 mg (has no administration in time range)  ondansetron  (ZOFRAN -ODT) disintegrating tablet 4 mg (4 mg Oral Given 08/15/23 1644)  sodium chloride  0.9 % bolus 1,000 mL (1,000 mLs Intravenous New Bag/Given 08/15/23 1645)  iohexol  (OMNIPAQUE ) 300 MG/ML solution 100 mL (100 mLs Intravenous Contrast Given 08/15/23 1747)    ED Course/ Medical Decision Making/ A&P                                 Medical Decision Making Kirk Jenkins is a 48 y.o. male here with abdominal pain, vomiting, near syncope.  Patient has vomiting and diarrhea since yesterday.  Also some right  flank pain.  Considered pyelonephritis versus small bowel obstruction versus gastroenteritis.  I think near-syncope likely is from dehydration.  Plan to get CBC and CMP and UA and CT abdomen pelvis.  Will hydrate and reassess.  7:41 PM Reviewed patient's labs and independently interpreted CT scan.  CBC and CMP unremarkable.  UA showed no obvious UTI.  CT does show diarrheal process consistent with gastroenteritis.  There is no obvious small bowel obstruction.  Patient does have chronically enlarged right kidney compared to the left but no obvious pyelonephritis.  There is some atelectasis of both lobes and I think likely aspiration and no signs of pneumonia.  Patient will be discharged home with Zofran  and imodium  as needed.   Problems Addressed: Abdominal pain, vomiting, and diarrhea: acute illness or injury Dehydration: acute illness or injury Near syncope: acute illness or injury  Amount and/or Complexity of Data Reviewed Radiology: ordered and independent interpretation performed. Decision-making details  documented in ED Course.  Risk Prescription drug management.    Final Clinical Impression(s) / ED Diagnoses Final diagnoses:  None    Rx / DC Orders ED Discharge Orders     None         Dalene Duck, MD 08/15/23 1943
